# Patient Record
Sex: Male | Born: 1985 | Race: Black or African American | Hispanic: No | Marital: Single | Smoking: Current every day smoker
Health system: Southern US, Community
[De-identification: ages and names within clinical notes are randomized; demographics above are authoritative.]

---

## 2006-07-08 ENCOUNTER — Emergency Department (HOSPITAL_COMMUNITY): Admission: EM | Admit: 2006-07-08 | Discharge: 2006-07-08 | Payer: Self-pay | Admitting: Family Medicine

## 2006-08-23 ENCOUNTER — Emergency Department (HOSPITAL_COMMUNITY): Admission: EM | Admit: 2006-08-23 | Discharge: 2006-08-23 | Payer: Self-pay | Admitting: Emergency Medicine

## 2006-11-05 ENCOUNTER — Emergency Department (HOSPITAL_COMMUNITY): Admission: EM | Admit: 2006-11-05 | Discharge: 2006-11-05 | Payer: Self-pay | Admitting: Emergency Medicine

## 2006-12-03 ENCOUNTER — Emergency Department (HOSPITAL_COMMUNITY): Admission: EM | Admit: 2006-12-03 | Discharge: 2006-12-03 | Payer: Self-pay | Admitting: Family Medicine

## 2006-12-03 ENCOUNTER — Emergency Department (HOSPITAL_COMMUNITY): Admission: EM | Admit: 2006-12-03 | Discharge: 2006-12-03 | Payer: Self-pay | Admitting: Emergency Medicine

## 2006-12-31 ENCOUNTER — Emergency Department (HOSPITAL_COMMUNITY): Admission: EM | Admit: 2006-12-31 | Discharge: 2006-12-31 | Payer: Self-pay | Admitting: Family Medicine

## 2016-08-12 ENCOUNTER — Emergency Department (HOSPITAL_COMMUNITY)
Admission: EM | Admit: 2016-08-12 | Discharge: 2016-08-12 | Disposition: A | Discharge status: Home / Self Care | Payer: 59 | Attending: Emergency Medicine | Admitting: Emergency Medicine

## 2016-08-12 ENCOUNTER — Encounter (HOSPITAL_COMMUNITY): Payer: Self-pay | Admitting: *Deleted

## 2016-08-12 DIAGNOSIS — H109 Unspecified conjunctivitis: Secondary | ICD-10-CM | POA: Diagnosis not present

## 2016-08-12 DIAGNOSIS — H579 Unspecified disorder of eye and adnexa: Secondary | ICD-10-CM | POA: Diagnosis present

## 2016-08-12 MED ORDER — TETRACAINE HCL 0.5 % OP SOLN
2.0000 [drp] | Freq: Once | OPHTHALMIC | Status: AC
  Administered 2016-08-12: 2 [drp] via OPHTHALMIC
  Filled 2016-08-12: qty 2

## 2016-08-12 MED ORDER — FLUORESCEIN SODIUM 1 MG OP STRP
1.0000 | ORAL_STRIP | Freq: Once | OPHTHALMIC | Status: AC
  Administered 2016-08-12: 1 via OPHTHALMIC
  Filled 2016-08-12: qty 1

## 2016-08-12 MED ORDER — POLYMYXIN B-TRIMETHOPRIM 10000-0.1 UNIT/ML-% OP SOLN: 2.0000 [drp] | OPHTHALMIC | 0 refills | Status: DC

## 2016-08-12 NOTE — ED Provider Notes (Signed)
[facility] Provider Note   CSN: [id number] Arrival date & time: 08/12/16  1736  By signing my name below, I, Modena JanskyAlbert Thayil, attest that this documentation has been prepared under the direction and in the presence of non-physician practitioner, Dub MikesSamantha Tripp Marnee Sherrard, PA-C. Electronically Signed: Modena JanskyAlbert Thayil, Scribe. 08/12/2016. 6:14 PM.  History   Chief Complaint Chief Complaint  Patient presents with  . Eye Problem   The history is provided by the patient. No language interpreter was used.   HPI Comments: Jared Escobar is a 30 y.o. male who presents to the Emergency Department complaining of bilateral eye redness that started 3 day ago. He states he woke up with bilateral eye symptoms in the morning. Associated symptoms include eye itchiness, eyelid pain, crusty eye discharge upon waking up, and a "knot" below his ear. Symptoms unrelieved by visine eye drops. Pt brother, sister and neice have similar symptoms and pt works at a nursing home. Denies visual disturbance or contact lens use.   History reviewed. No pertinent past medical history.  There are no active problems to display for this patient.   History reviewed. No pertinent surgical history.     Home Medications    Prior to Admission medications   Not on File    Family History No family history on file.  Social History Social History  Substance Use Topics  . Smoking status: Never Smoker  . Smokeless tobacco: Never Used  . Alcohol use Yes     Allergies   Review of patient's allergies indicates no known allergies.   Review of Systems Review of Systems 10 Systems reviewed and all are negative for acute change except as noted in the HPI.  Physical Exam Updated Vital Signs BP 143/77 (BP Location: Right Arm)   Pulse (!) 58   Temp 98.1 F (36.7 C) (Oral)   Resp 16   SpO2 100%   Physical Exam  Constitutional: He is oriented to person, place, and time. He appears well-developed and  well-nourished. No distress.  HENT:  Head: Normocephalic and atraumatic.  Eyes: EOM are normal. Pupils are equal, round, and reactive to light. Right eye exhibits no discharge. Left eye exhibits no discharge. Right conjunctiva is injected. Left conjunctiva is injected. No scleral icterus.  Slit lamp exam:      The right eye shows no corneal abrasion, no corneal ulcer, no foreign body, no fluorescein uptake and no anterior chamber bulge.       The left eye shows no corneal abrasion, no corneal ulcer, no foreign body, no fluorescein uptake and no anterior chamber bulge.  Cardiovascular: Normal rate.   Pulmonary/Chest: Effort normal.  Neurological: He is alert and oriented to person, place, and time. Coordination normal.  Skin: Skin is warm and dry. No rash noted. He is not diaphoretic. No erythema. No pallor.  Psychiatric: He has a normal mood and affect. His behavior is normal.  Nursing note and vitals reviewed.    ED Treatments / Results  DIAGNOSTIC STUDIES: Oxygen Saturation is 100% on RA, normal by my interpretation.    COORDINATION OF CARE: 6:18 PM- Pt advised of plan for treatment and pt agrees.  Labs (all labs ordered are listed, but only abnormal results are displayed) Labs Reviewed - No data to display  EKG  EKG Interpretation None       Radiology No results found.  Procedures Procedures (including critical care time)  Medications Ordered in ED Medications  tetracaine (PONTOCAINE) 0.5 % ophthalmic solution 2 drop (not administered)  fluorescein ophthalmic strip 1 strip (not administered)     Initial Impression / Assessment and Plan / ED Course  I have reviewed the triage vital signs and the nursing notes.  Pertinent labs & imaging results that were available during my care of the patient were reviewed by me and considered in my medical decision making (see chart for details).  Clinical Course    Pt dx likely bacterial conjunctivitis based on presentation,  history & eye exam. No evidence of HSV or VSV infection. Pt is not a contact lens wearer.  Exam non-concerning for orbital cellulitis, hyphema, corneal ulcers, corneal abrasions or trauma.  Patient will be discharged home with Polytrim drops every 1 to 2 hours for 7 days.  Patient has been instructed to use cool compresses and practice personal hygiene with frequent hand washing. Patient understands to follow up with ophthalmology, especially if new symptoms including change in vision, purulent drainage, or entrapment occur.  Final Clinical Impressions(s) / ED Diagnoses   Final diagnoses:  Bilateral conjunctivitis    New Prescriptions New Prescriptions   No medications on file   I personally performed the services described in this documentation, which was scribed in my presence. The recorded information has been reviewed and is accurate.      Lester Kinsman [place], PA-C 08/12/16 1841    Shaune Pollack, MD 08/14/16 1200

## 2016-08-12 NOTE — ED Triage Notes (Signed)
The pt has had bi-lateral eye redness and irritation with itching since saturday

## 2016-08-12 NOTE — Discharge Instructions (Signed)
Use antibiotic eyedrops as prescribed. Wash hands frequently and avoid rubbing eyes. Follow-up with ophthalmologist your symptoms worsen in anyway. Return to the ED if you experience changing her vision, redness and swelling around your eye, pain behind your eye area or with eye movement, fevers, chills.

## 2017-02-15 ENCOUNTER — Emergency Department (HOSPITAL_COMMUNITY): Payer: 59

## 2017-02-15 ENCOUNTER — Encounter (HOSPITAL_COMMUNITY): Payer: Self-pay | Admitting: Emergency Medicine

## 2017-02-15 ENCOUNTER — Emergency Department (HOSPITAL_COMMUNITY)
Admission: EM | Admit: 2017-02-15 | Discharge: 2017-02-15 | Disposition: A | Discharge status: Home / Self Care | Payer: 59 | Attending: Emergency Medicine | Admitting: Emergency Medicine

## 2017-02-15 DIAGNOSIS — Y999 Unspecified external cause status: Secondary | ICD-10-CM | POA: Insufficient documentation

## 2017-02-15 DIAGNOSIS — S39012A Strain of muscle, fascia and tendon of lower back, initial encounter: Secondary | ICD-10-CM | POA: Diagnosis not present

## 2017-02-15 DIAGNOSIS — Y92410 Unspecified street and highway as the place of occurrence of the external cause: Secondary | ICD-10-CM | POA: Diagnosis not present

## 2017-02-15 DIAGNOSIS — Y939 Activity, unspecified: Secondary | ICD-10-CM | POA: Diagnosis not present

## 2017-02-15 DIAGNOSIS — S3992XA Unspecified injury of lower back, initial encounter: Secondary | ICD-10-CM | POA: Diagnosis present

## 2017-02-15 NOTE — Discharge Instructions (Signed)
Your exam and x-rays are very reassuring. It is important for you to follow-up with your doctor. You may take ibuprofen, 600 mg every 6 hours as needed for discomfort. Return to ED for any new or worsening symptoms.
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
## 2018-02-26 ENCOUNTER — Other Ambulatory Visit: Payer: Self-pay

## 2018-02-26 ENCOUNTER — Encounter (HOSPITAL_COMMUNITY): Payer: Self-pay | Admitting: Emergency Medicine

## 2018-02-26 ENCOUNTER — Emergency Department (HOSPITAL_COMMUNITY)
Admission: EM | Admit: 2018-02-26 | Discharge: 2018-02-26 | Disposition: A | Discharge status: Home / Self Care | Payer: 59 | Attending: Emergency Medicine | Admitting: Emergency Medicine

## 2018-02-26 DIAGNOSIS — J111 Influenza due to unidentified influenza virus with other respiratory manifestations: Secondary | ICD-10-CM | POA: Insufficient documentation

## 2018-02-26 DIAGNOSIS — Z79899 Other long term (current) drug therapy: Secondary | ICD-10-CM | POA: Insufficient documentation

## 2018-02-26 DIAGNOSIS — R69 Illness, unspecified: Secondary | ICD-10-CM

## 2018-02-26 NOTE — ED Triage Notes (Signed)
Pt reports he has the flu.  "My whole body hurts."  Other complaints include nausea, chills, headache, congestion, productive cough.

## 2018-02-26 NOTE — ED Provider Notes (Signed)
[facility] Provider Note   CSN: [id number] Arrival date & time: 02/26/18  1933     History   Chief Complaint Chief Complaint  Patient presents with  . flu like symptoms    HPI Jared Escobar is a 32 y.o. male.  32yo M who p/w-like symptoms.  Patient states that yesterday evening he began having flu symptoms including cough, nasal congestion, headache, chills, nausea, and body aches.  He denies any vomiting, diarrhea, or sore throat.  1 of the children in the home has been sick with similar symptoms recently.  No recent travel.  No rash.  He took TheraFlu earlier.  No hx of Lung problems.  The history is provided by the patient.    History reviewed. No pertinent past medical history.  There are no active problems to display for this patient.   History reviewed. No pertinent surgical history.      Home Medications    Prior to Admission medications   Medication Sig Start Date End Date Taking? Authorizing Provider  trimethoprim-polymyxin b (POLYTRIM) ophthalmic solution Place 2 drops into both eyes every 4 (four) hours. Take for 7 days 08/12/16   [place], Lester KinsmanSamantha Tripp, PA-C    Family History No family history on file.  Social History Social History   Tobacco Use  . Smoking status: Never Smoker  . Smokeless tobacco: Never Used  Substance Use Topics  . Alcohol use: Yes  . Drug use: Not on file     Allergies   Patient has no known allergies.   Review of Systems Review of Systems All other systems reviewed and are negative except that which was mentioned in HPI   Physical Exam Updated Vital Signs BP 120/80   Pulse 73   Temp 98.5 F (36.9 C) (Oral)   Resp 18   Ht 5\' 9"  (1.753 m)   Wt 65.8 kg (145 lb)   SpO2 95%   BMI 21.41 kg/m   Physical Exam  Constitutional: He is oriented to person, place, and time. He appears well-developed and well-nourished. No distress.  Asleep, comfortable  HENT:  Head:  Normocephalic and atraumatic.  Mouth/Throat: Oropharynx is clear and moist. No oropharyngeal exudate.  Moist mucous membranes  Eyes: Pupils are equal, round, and reactive to light. Conjunctivae are normal.  Neck: Neck supple.  Cardiovascular: Normal rate, regular rhythm and normal heart sounds.  No murmur heard. Pulmonary/Chest: Effort normal and breath sounds normal.  Abdominal: Soft. Bowel sounds are normal. He exhibits no distension. There is no tenderness.  Musculoskeletal: He exhibits no edema.  Lymphadenopathy:    He has no cervical adenopathy.  Neurological: He is alert and oriented to person, place, and time.  Fluent speech  Skin: Skin is warm and dry.  Psychiatric: He has a normal mood and affect. Judgment normal.  Nursing note and vitals reviewed.    ED Treatments / Results  Labs (all labs ordered are listed, but only abnormal results are displayed) Labs Reviewed - No data to display  EKG None  Radiology No results found.  Procedures Procedures (including critical care time)  Medications Ordered in ED Medications - No data to display   Initial Impression / Assessment and Plan / ED Course  I have reviewed the triage vital signs and the nursing notes.     Resting comfortably on exam w/ normal VS. cough and congestion associated with his symptoms, I suspect flu or flulike illness.  No signs or symptoms to suggest meningitis.  No breathing problems and normal O2 saturation.  Discussed supportive measures and extensively reviewed return precautions.  Patient and father voiced understanding.  Final Clinical Impressions(s) / ED Diagnoses   Final diagnoses:  Influenza-like illness    ED Discharge Orders    None       Little, Ambrose Finland, MD 02/26/18 2326

## 2018-02-26 NOTE — ED Notes (Signed)
Pt stable, ambulatory, states understanding of discharge instructions 

## 2018-02-26 NOTE — ED Notes (Signed)
Pt's family went to get car, pt getting dressed for discharge

## 2020-08-13 ENCOUNTER — Emergency Department (HOSPITAL_COMMUNITY): Payer: Self-pay

## 2020-08-13 ENCOUNTER — Encounter (HOSPITAL_COMMUNITY): Payer: Self-pay

## 2020-08-13 ENCOUNTER — Ambulatory Visit (HOSPITAL_COMMUNITY)
Admission: EM | Admit: 2020-08-13 | Discharge: 2020-08-13 | Disposition: A | Discharge status: Home / Self Care | Payer: Self-pay

## 2020-08-13 ENCOUNTER — Other Ambulatory Visit: Payer: Self-pay

## 2020-08-13 ENCOUNTER — Emergency Department (HOSPITAL_COMMUNITY)
Admission: EM | Admit: 2020-08-13 | Discharge: 2020-08-14 | Disposition: A | Discharge status: Home / Self Care | Payer: Self-pay | Attending: Emergency Medicine | Admitting: Emergency Medicine

## 2020-08-13 DIAGNOSIS — S50311A Abrasion of right elbow, initial encounter: Secondary | ICD-10-CM | POA: Insufficient documentation

## 2020-08-13 DIAGNOSIS — Z5321 Procedure and treatment not carried out due to patient leaving prior to being seen by health care provider: Secondary | ICD-10-CM | POA: Insufficient documentation

## 2020-08-13 DIAGNOSIS — S40211A Abrasion of right shoulder, initial encounter: Secondary | ICD-10-CM | POA: Insufficient documentation

## 2020-08-13 DIAGNOSIS — Y999 Unspecified external cause status: Secondary | ICD-10-CM | POA: Insufficient documentation

## 2020-08-13 DIAGNOSIS — M25551 Pain in right hip: Secondary | ICD-10-CM | POA: Insufficient documentation

## 2020-08-13 DIAGNOSIS — R0781 Pleurodynia: Secondary | ICD-10-CM | POA: Insufficient documentation

## 2020-08-13 DIAGNOSIS — M25561 Pain in right knee: Secondary | ICD-10-CM | POA: Insufficient documentation

## 2020-08-13 DIAGNOSIS — Y939 Activity, unspecified: Secondary | ICD-10-CM | POA: Insufficient documentation

## 2020-08-13 DIAGNOSIS — Y929 Unspecified place or not applicable: Secondary | ICD-10-CM | POA: Insufficient documentation

## 2020-08-13 DIAGNOSIS — S40811A Abrasion of right upper arm, initial encounter: Secondary | ICD-10-CM | POA: Insufficient documentation
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
## 2020-08-14 NOTE — ED Notes (Signed)
Called form vitals no response.
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
## 2023-11-29 ENCOUNTER — Encounter (HOSPITAL_COMMUNITY): Payer: Self-pay

## 2023-11-29 ENCOUNTER — Ambulatory Visit (HOSPITAL_COMMUNITY)
Admission: EM | Admit: 2023-11-29 | Discharge: 2023-11-29 | Disposition: A | Discharge status: Home / Self Care | Payer: Managed Care, Other (non HMO) | Attending: Physician Assistant | Admitting: Physician Assistant

## 2023-11-29 DIAGNOSIS — L03116 Cellulitis of left lower limb: Secondary | ICD-10-CM | POA: Diagnosis not present

## 2023-11-29 DIAGNOSIS — T24232A Burn of second degree of left lower leg, initial encounter: Secondary | ICD-10-CM | POA: Diagnosis not present

## 2023-11-29 DIAGNOSIS — Z23 Encounter for immunization: Secondary | ICD-10-CM | POA: Diagnosis not present

## 2023-11-29 MED ORDER — CEPHALEXIN 500 MG PO CAPS: 500.0000 mg | ORAL_CAPSULE | Freq: Three times a day (TID) | ORAL | 0 refills | Status: AC

## 2023-11-29 MED ORDER — IBUPROFEN 600 MG PO TABS: 600.0000 mg | ORAL_TABLET | Freq: Three times a day (TID) | ORAL | 0 refills | Status: AC | PRN

## 2023-11-29 MED ORDER — MUPIROCIN 2 % EX OINT: 1.0000 | TOPICAL_OINTMENT | Freq: Every day | CUTANEOUS | 1 refills | Status: AC

## 2023-11-29 MED ORDER — TETANUS-DIPHTH-ACELL PERTUSSIS 5-2.5-18.5 LF-MCG/0.5 IM SUSY
PREFILLED_SYRINGE | INTRAMUSCULAR | Status: AC
  Filled 2023-11-29: qty 0.5

## 2023-11-29 MED ORDER — TETANUS-DIPHTH-ACELL PERTUSSIS 5-2.5-18.5 LF-MCG/0.5 IM SUSY
0.5000 mL | PREFILLED_SYRINGE | Freq: Once | INTRAMUSCULAR | Status: AC
  Administered 2023-11-29: 0.5 mL via INTRAMUSCULAR

## 2023-11-29 NOTE — Discharge Instructions (Addendum)
We updated your tetanus today.  Keep this area clean with soap and water.  Apply Bactroban ointment daily with dressing changes.  Take ibuprofen for pain relief.  Do not take additional NSAIDs with this medication including aspirin, ibuprofen/Advil, naproxen/Aleve.  You can use acetaminophen/Tylenol as needed for additional pain relief.  Given the increasing redness we are going to cover for an infection.  Start cephalexin 3 times daily for 1 week.  I would like you to follow-up with wound care to ensure that this is healing appropriately; call to schedule an appointment.  If anything worsens and you have increasing pain, spread of redness, fever, nausea, vomiting you should be seen immediately.
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
